# Patient Record
Sex: Female | Born: 1995 | Race: White | Hispanic: No | Marital: Single | Smoking: Never smoker
Health system: Southern US, Community
[De-identification: ages and names within clinical notes are randomized; demographics above are authoritative.]

## PROBLEM LIST (undated history)

## (undated) DIAGNOSIS — D649 Anemia, unspecified: Secondary | ICD-10-CM

## (undated) DIAGNOSIS — K5090 Crohn's disease, unspecified, without complications: Secondary | ICD-10-CM

## (undated) DIAGNOSIS — T7840XA Allergy, unspecified, initial encounter: Secondary | ICD-10-CM

## (undated) DIAGNOSIS — F419 Anxiety disorder, unspecified: Secondary | ICD-10-CM

## (undated) HISTORY — DX: Anxiety disorder, unspecified: F41.9

## (undated) HISTORY — DX: Allergy, unspecified, initial encounter: T78.40XA

## (undated) HISTORY — PX: BOWEL RESECTION: SHX1257

## (undated) HISTORY — DX: Crohn's disease, unspecified, without complications: K50.90

## (undated) HISTORY — PX: ILEOSTOMY: SHX1783

## (undated) HISTORY — DX: Anemia, unspecified: D64.9

## (undated) HISTORY — PX: BLADDER REPAIR: SHX76

---
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
## 2013-05-17 ENCOUNTER — Telehealth: Payer: Self-pay

## 2013-05-17 NOTE — Telephone Encounter (Signed)
Please advise 

## 2013-05-17 NOTE — Telephone Encounter (Signed)
Patient has Chron's disease and needs a statement written on a letterhead that she is ok to return to school at [employer]. Please patient at [phone] or her mother Trula Ore at [phone]

## 2013-05-19 ENCOUNTER — Encounter: Payer: Self-pay | Admitting: Internal Medicine

## 2013-11-10 ENCOUNTER — Other Ambulatory Visit: Payer: Self-pay | Admitting: Internal Medicine

## 2013-12-06 ENCOUNTER — Other Ambulatory Visit: Payer: Self-pay | Admitting: Internal Medicine

## 2014-09-21 ENCOUNTER — Telehealth: Payer: Self-pay

## 2014-09-21 NOTE — Telephone Encounter (Signed)
Pt's mother called to see when her daughter was seen last. States she is needing FMLA paperwork filled out and is wanting to know if we could do it if she had been here recently. Advised her of the date of pt's last visit.

## 2015-08-05 ENCOUNTER — Encounter (HOSPITAL_BASED_OUTPATIENT_CLINIC_OR_DEPARTMENT_OTHER): Payer: Self-pay | Admitting: *Deleted

## 2015-08-05 ENCOUNTER — Emergency Department (HOSPITAL_BASED_OUTPATIENT_CLINIC_OR_DEPARTMENT_OTHER)
Admission: EM | Admit: 2015-08-05 | Discharge: 2015-08-05 | Disposition: A | Discharge status: Home / Self Care | Payer: 59 | Attending: Emergency Medicine | Admitting: Emergency Medicine

## 2015-08-05 ENCOUNTER — Emergency Department (HOSPITAL_BASED_OUTPATIENT_CLINIC_OR_DEPARTMENT_OTHER): Payer: 59

## 2015-08-05 DIAGNOSIS — Z862 Personal history of diseases of the blood and blood-forming organs and certain disorders involving the immune mechanism: Secondary | ICD-10-CM | POA: Diagnosis not present

## 2015-08-05 DIAGNOSIS — F419 Anxiety disorder, unspecified: Secondary | ICD-10-CM | POA: Insufficient documentation

## 2015-08-05 DIAGNOSIS — Z23 Encounter for immunization: Secondary | ICD-10-CM | POA: Insufficient documentation

## 2015-08-05 DIAGNOSIS — Z8719 Personal history of other diseases of the digestive system: Secondary | ICD-10-CM | POA: Insufficient documentation

## 2015-08-05 DIAGNOSIS — Y9289 Other specified places as the place of occurrence of the external cause: Secondary | ICD-10-CM | POA: Diagnosis not present

## 2015-08-05 DIAGNOSIS — Y998 Other external cause status: Secondary | ICD-10-CM | POA: Insufficient documentation

## 2015-08-05 DIAGNOSIS — S61431A Puncture wound without foreign body of right hand, initial encounter: Secondary | ICD-10-CM | POA: Diagnosis not present

## 2015-08-05 DIAGNOSIS — W5501XA Bitten by cat, initial encounter: Secondary | ICD-10-CM | POA: Diagnosis not present

## 2015-08-05 DIAGNOSIS — S61432A Puncture wound without foreign body of left hand, initial encounter: Secondary | ICD-10-CM | POA: Insufficient documentation

## 2015-08-05 DIAGNOSIS — Y9389 Activity, other specified: Secondary | ICD-10-CM | POA: Diagnosis not present

## 2015-08-05 DIAGNOSIS — Z79899 Other long term (current) drug therapy: Secondary | ICD-10-CM | POA: Insufficient documentation

## 2015-08-05 DIAGNOSIS — S61451A Open bite of right hand, initial encounter: Secondary | ICD-10-CM

## 2015-08-05 MED ORDER — OXYCODONE-ACETAMINOPHEN 5-325 MG PO TABS: 1.0000 | ORAL_TABLET | ORAL | Status: AC | PRN

## 2015-08-05 MED ORDER — OXYCODONE-ACETAMINOPHEN 5-325 MG PO TABS
1.0000 | ORAL_TABLET | Freq: Once | ORAL | Status: AC
  Administered 2015-08-05: 1 via ORAL
  Filled 2015-08-05: qty 1

## 2015-08-05 MED ORDER — AMOXICILLIN-POT CLAVULANATE 875-125 MG PO TABS
1.0000 | ORAL_TABLET | Freq: Once | ORAL | Status: AC
  Administered 2015-08-05: 1 via ORAL
  Filled 2015-08-05: qty 1

## 2015-08-05 MED ORDER — AMOXICILLIN-POT CLAVULANATE 875-125 MG PO TABS: 1.0000 | ORAL_TABLET | Freq: Two times a day (BID) | ORAL | Status: AC

## 2015-08-05 MED ORDER — TETANUS-DIPHTH-ACELL PERTUSSIS 5-2.5-18.5 LF-MCG/0.5 IM SUSP
0.5000 mL | Freq: Once | INTRAMUSCULAR | Status: AC
  Administered 2015-08-05: 0.5 mL via INTRAMUSCULAR
  Filled 2015-08-05: qty 0.5

## 2015-08-05 NOTE — ED Notes (Signed)
Patient stable and ambulatory.  Patient verbalizes understanding of follow-up and discharge instructions.

## 2015-08-05 NOTE — ED Provider Notes (Signed)
CSN: [id number]     Arrival date & time 08/05/15  1133 History   First MD Initiated Contact with Patient 08/05/15 1200     Chief Complaint  Patient presents with  . Animal Bite     (Consider location/radiation/quality/duration/timing/severity/associated sxs/prior Treatment) HPI   Lauren Williamson is a 19 y.o. female, pt with history of Crohn's disease and Anxiety, presents with multiple cat bites to her right hand that occurred about PTA. Pt rates the pain at 8/10, throbbing/aching, and non-radiating. No numbness/tingling, weakness, or any other pain or complaints. Pt denies getting bit anywhere else.     Past Medical History  Diagnosis Date  . Allergy   . Anemia   . Anxiety   . Crohn's disease [facility])    Past Surgical History  Procedure Laterality Date  . Bowel resection    . Ileostomy  has been reversed  . Bladder repair     No family history on file. Social History  Substance Use Topics  . Smoking status: Never Smoker   . Smokeless tobacco: Never Used  . Alcohol Use: Yes     Comment: 1x/ month   OB History    No data available     Review of Systems  Constitutional: Negative for fever and chills.  Gastrointestinal: Negative for nausea and vomiting.  Skin: Positive for wound. Negative for color change and pallor.       Puncture wounds on right hand  Neurological: Negative for weakness and numbness.      Allergies  Morphine and related and Vancomycin  Home Medications   Prior to Admission medications   Medication Sig Start Date End Date Taking? Authorizing Provider  cetirizine (ZYRTEC) 10 MG tablet Take 10 mg by mouth daily.   Yes Historical Provider, MD  LORazepam (ATIVAN) 0.5 MG tablet Take 0.5 mg by mouth every 8 (eight) hours as needed for anxiety.   Yes Historical Provider, MD  norgestimate-ethinyl estradiol (PREVIFEM) 0.25-35 MG-MCG tablet Take 1 tablet by mouth daily. PATIENT NEEDS OFFICE VISIT FOR ADDITIONAL REFILLS 11/10/13  Yes Tonye Pearson, MD  amoxicillin-clavulanate (AUGMENTIN) 875-125 MG tablet Take 1 tablet by mouth every 12 (twelve) hours. 08/05/15   Xitlalic Maslin C Daly Whipkey, PA-C  azathioprine (IMURAN) 100 MG tablet Take 125 mg by mouth daily.    Historical Provider, MD  BIOTIN PO Take 1 tablet by mouth.    Historical Provider, MD  InFLIXimab (REMICADE IV) Inject into the vein.    Historical Provider, MD  mesalamine (PENTASA) 500 MG CR capsule Take 500 mg by mouth 2 (two) times daily.    Historical Provider, MD  oxyCODONE-acetaminophen (PERCOCET/ROXICET) 5-325 MG tablet Take 1-2 tablets by mouth every 4 (four) hours as needed for severe pain. 08/05/15   Brithney Bensen C Rhen Dossantos, PA-C   BP 120/86 mmHg  Pulse 70  Temp(Src) 98.3 F (36.8 C) (Oral)  Resp 18  Ht 5' 6.5" (1.689 m)  Wt 140 lb (63.504 kg)  BMI 22.26 kg/m2  SpO2 99%  LMP 07/08/2015 (Within Days) Physical Exam  Constitutional: She appears well-developed and well-nourished.  HENT:  Head: Normocephalic and atraumatic.  Musculoskeletal:  Pt has full ROM in right hand and wrist. Grips equal.   Neurological: She is alert.  No sensory deficits. Strength 5/5.  Skin: Skin is warm and dry.  Small puncture wounds to both dorsum and palmar surfaces of right hand. Bleeding controlled. No evidence of infection at this time.     ED Course  Procedures (including critical care  time) Labs Review Labs Reviewed - No data to display  Imaging Review Dg Hand Complete Right  08/05/2015  CLINICAL DATA:  Initial encounter.  CT by and scratches. EXAM: RIGHT HAND - COMPLETE 3+ VIEW COMPARISON:  None. FINDINGS: No evidence of fracture of the carpal or metacarpal bones. Radiocarpal joint is intact. Phalanges are normal. No soft tissue injury. No foreign body. Small soft tissue density along the skin adjacent to the fifth metacarpal could represent soft tissue injury IMPRESSION: 1. No fracture or foreign body. Electronically Signed   By: Genevive BiStewart  Edmunds M.D.   On: 08/05/2015 13:04   I have  personally reviewed and evaluated these images and lab results as part of my medical decision-making.   EKG Interpretation None      MDM   Final diagnoses:  Cat bite of right hand, initial encounter    Lauren Williamson presents with a cat bite.  Findings and plan of care discussed with Pricilla LovelessScott Goldston, MD.  Pt has isolated puncture wounds on her right hand. Pt to receive wound irrigation, pain management and ABX.  Pt unsure of last tetanus. Tetanus vaccine to be administered. Xrays free from fracture. Pt to be discharged with augmentin, pain control, and instructions for wound care. Pt to follow up with hand surgeon in a weak, or sooner should signs of infection arise. Pt mother at bedside confirms she will be giving pt a ride home. Cat was taken to the humane society, who said they would watch her for 10 days for signs of rabies or other illness.     Anselm PancoastShawn C Savan Ruta, PA-C 08/05/15 1317  Anselm PancoastShawn C Gorman Safi, PA-C 08/05/15 1319  Anselm PancoastShawn C Draydon Clairmont, PA-C 08/05/15 1329  Anselm PancoastShawn C Kieran Nachtigal, PA-C 08/05/15 1356  Pricilla LovelessScott Goldston, MD 08/06/15 [phone]

## 2015-08-05 NOTE — ED Notes (Signed)
[facility] and mother (owner of cat).  Completed form given to registration.

## 2015-08-05 NOTE — Discharge Instructions (Signed)
You have been seen today for a cat bite. Follow up with PCP as needed. Return to ED should symptoms worsen.  It is advised that you get your wound rechecked in 2-3 days to assure proper healing. You may do this at your PCP or come back to the ED.

## 2015-08-05 NOTE — ED Notes (Signed)
Cat bite to right hand. Pt bitten by a cat she had obtained from a friend. Puncture marks to right hand

## 2015-11-16 DIAGNOSIS — K50919 Crohn's disease, unspecified, with unspecified complications: Secondary | ICD-10-CM

## 2018-05-02 ENCOUNTER — Emergency Department (HOSPITAL_BASED_OUTPATIENT_CLINIC_OR_DEPARTMENT_OTHER)
Admission: EM | Admit: 2018-05-02 | Discharge: 2018-05-02 | Disposition: A | Discharge status: Home / Self Care | Payer: 59 | Attending: Emergency Medicine | Admitting: Emergency Medicine

## 2018-05-02 ENCOUNTER — Emergency Department (HOSPITAL_BASED_OUTPATIENT_CLINIC_OR_DEPARTMENT_OTHER): Payer: 59

## 2018-05-02 ENCOUNTER — Other Ambulatory Visit: Payer: Self-pay

## 2018-05-02 ENCOUNTER — Encounter (HOSPITAL_BASED_OUTPATIENT_CLINIC_OR_DEPARTMENT_OTHER): Payer: Self-pay | Admitting: Emergency Medicine

## 2018-05-02 DIAGNOSIS — Z79899 Other long term (current) drug therapy: Secondary | ICD-10-CM | POA: Insufficient documentation

## 2018-05-02 DIAGNOSIS — N201 Calculus of ureter: Secondary | ICD-10-CM | POA: Diagnosis not present

## 2018-05-02 DIAGNOSIS — R1031 Right lower quadrant pain: Secondary | ICD-10-CM | POA: Diagnosis present

## 2018-05-02 LAB — COMPREHENSIVE METABOLIC PANEL
ALK PHOS: 46 U/L (ref 38–126)
ALT: 16 U/L (ref 0–44)
AST: 23 U/L (ref 15–41)
Albumin: 4.4 g/dL (ref 3.5–5.0)
Anion gap: 11 (ref 5–15)
BILIRUBIN TOTAL: 1.6 mg/dL — AB (ref 0.3–1.2)
BUN: 12 mg/dL (ref 6–20)
CHLORIDE: 104 mmol/L (ref 98–111)
CO2: 24 mmol/L (ref 22–32)
CREATININE: 0.88 mg/dL (ref 0.44–1.00)
Calcium: 9.4 mg/dL (ref 8.9–10.3)
Glucose, Bld: 104 mg/dL — ABNORMAL HIGH (ref 70–99)
POTASSIUM: 3.9 mmol/L (ref 3.5–5.1)
SODIUM: 139 mmol/L (ref 135–145)
Total Protein: 8.6 g/dL — ABNORMAL HIGH (ref 6.5–8.1)

## 2018-05-02 LAB — URINALYSIS, ROUTINE W REFLEX MICROSCOPIC
Bilirubin Urine: NEGATIVE
GLUCOSE, UA: NEGATIVE mg/dL
Ketones, ur: NEGATIVE mg/dL
LEUKOCYTES UA: NEGATIVE
Nitrite: NEGATIVE
PH: 6 (ref 5.0–8.0)
PROTEIN: NEGATIVE mg/dL
SPECIFIC GRAVITY, URINE: 1.01 (ref 1.005–1.030)

## 2018-05-02 LAB — PREGNANCY, URINE: Preg Test, Ur: NEGATIVE

## 2018-05-02 LAB — CBC
HEMATOCRIT: 42.4 % (ref 36.0–46.0)
HEMOGLOBIN: 14.7 g/dL (ref 12.0–15.0)
MCH: 31.7 pg (ref 26.0–34.0)
MCHC: 34.7 g/dL (ref 30.0–36.0)
MCV: 91.4 fL (ref 78.0–100.0)
Platelets: 281 10*3/uL (ref 150–400)
RBC: 4.64 MIL/uL (ref 3.87–5.11)
RDW: 12.2 % (ref 11.5–15.5)
WBC: 11 10*3/uL — ABNORMAL HIGH (ref 4.0–10.5)

## 2018-05-02 LAB — LIPASE, BLOOD: LIPASE: 31 U/L (ref 11–51)

## 2018-05-02 LAB — URINALYSIS, MICROSCOPIC (REFLEX): RBC / HPF: >50 RBC/hpf (ref 0–5)

## 2018-05-02 MED ORDER — HYDROCODONE-ACETAMINOPHEN 5-325 MG PO TABS: 1.0000 | ORAL_TABLET | ORAL | 0 refills | Status: AC | PRN

## 2018-05-02 MED ORDER — SODIUM CHLORIDE 0.9 % IV BOLUS
1000.0000 mL | Freq: Once | INTRAVENOUS | Status: AC
  Administered 2018-05-02: 1000 mL via INTRAVENOUS

## 2018-05-02 MED ORDER — HYDROMORPHONE HCL 1 MG/ML IJ SOLN
1.0000 mg | Freq: Once | INTRAMUSCULAR | Status: AC
  Administered 2018-05-02: 1 mg via INTRAVENOUS
  Filled 2018-05-02: qty 1

## 2018-05-02 MED ORDER — ONDANSETRON HCL 4 MG/2ML IJ SOLN
4.0000 mg | Freq: Once | INTRAMUSCULAR | Status: AC
  Administered 2018-05-02: 4 mg via INTRAVENOUS
  Filled 2018-05-02: qty 2

## 2018-05-02 NOTE — ED Triage Notes (Signed)
Patient states that she has had pain to her right flank x 24 hours  - the patient reports that she has had N/V

## 2018-05-02 NOTE — ED Provider Notes (Signed)
[facility] EMERGENCY DEPARTMENT Provider Note   CSN: [id number] Arrival date & time: 05/02/18  4098     History   Chief Complaint Chief Complaint  Patient presents with  . Flank Pain    HPI Lauren Williamson is a 22 y.o. female.  HPI  22 year old female presents with right-sided back pain starting last night.  She took some Tylenol with no relief.  Pain comes and goes.  When it comes it is like a hot poker/screwdriver that is being stabbed into her back.  It goes straight through to her right abdomen.  There is some right flank pain as well.  No fevers but she is had some chills and has been vomiting.  Took some Zofran with partial relief of the nausea.  Pain is rated as severe.  She is been having some decreased urine output but no burning or blood.  LMP 2 weeks ago.  Past Medical History:  Diagnosis Date  . Allergy   . Anemia   . Anxiety   . Crohn's disease [facility])     Patient Active Problem List   Diagnosis Date Noted  . GAD (generalized anxiety disorder) 09/11/2012  . Inflammatory bowel disease (Crohn's disease) (HCC) 06/02/2012  . Scoliosis 06/02/2012    Past Surgical History:  Procedure Laterality Date  . BLADDER REPAIR    . BOWEL RESECTION    . ILEOSTOMY  has been reversed     OB History   None      Home Medications    Prior to Admission medications   Medication Sig Start Date End Date Taking? Authorizing Provider  dicyclomine (BENTYL) 10 MG capsule Take 10 mg by mouth 4 (four) times daily -  before meals and at bedtime.   Yes [provider]  amoxicillin-clavulanate (AUGMENTIN) 875-125 MG tablet Take 1 tablet by mouth every 12 (twelve) hours. 08/05/15   Joy, Shawn C, PA-C  azathioprine (IMURAN) 100 MG tablet Take 125 mg by mouth daily.    [provider]  BIOTIN PO Take 1 tablet by mouth.    [provider]  cetirizine (ZYRTEC) 10 MG tablet Take 10 mg by mouth daily.    [provider]    HYDROcodone-acetaminophen (NORCO) 5-325 MG tablet Take 1-2 tablets by mouth every 4 (four) hours as needed for severe pain. 05/02/18   Pricilla Loveless, MD  InFLIXimab (REMICADE IV) Inject into the vein.    [provider]  LORazepam (ATIVAN) 0.5 MG tablet Take 0.5 mg by mouth every 8 (eight) hours as needed for anxiety.    [provider]  mesalamine (PENTASA) 500 MG CR capsule Take 500 mg by mouth 2 (two) times daily.    [provider]  norgestimate-ethinyl estradiol (PREVIFEM) 0.25-35 MG-MCG tablet Take 1 tablet by mouth daily. PATIENT NEEDS OFFICE VISIT FOR ADDITIONAL REFILLS 11/10/13   Tonye Pearson, MD  oxyCODONE-acetaminophen (PERCOCET/ROXICET) 5-325 MG tablet Take 1-2 tablets by mouth every 4 (four) hours as needed for severe pain. 08/05/15   Anselm Pancoast, PA-C    Family History History reviewed. No pertinent family history.  Social History Social History   Tobacco Use  . Smoking status: Never Smoker  . Smokeless tobacco: Never Used  Substance Use Topics  . Alcohol use: Yes    Comment: 1x/ month  . Drug use: No     Allergies   Morphine and related and Vancomycin   Review of Systems Review of Systems  Constitutional: Positive for chills. Negative for fever.  Gastrointestinal: Positive for abdominal pain, nausea and vomiting.  Genitourinary: Positive for decreased urine volume and flank pain. Negative for dysuria.  Musculoskeletal: Positive for back pain.  All other systems reviewed and are negative.    Physical Exam Updated Vital Signs BP 115/79   Pulse 79   Temp 98.8 F (37.1 C) (Oral)   Resp 18   Ht 5\' 6"  (1.676 m)   Wt 65.8 kg (145 lb)   LMP 04/18/2018   SpO2 100%   BMI 23.40 kg/m   Physical Exam  Constitutional: She is oriented to person, place, and time. She appears well-developed and well-nourished.  HENT:  Head: Normocephalic and atraumatic.  Right Ear: External ear normal.  Left Ear: External ear normal.  Nose:  Nose normal.  Eyes: Right eye exhibits no discharge. Left eye exhibits no discharge.  Cardiovascular: Normal rate, regular rhythm and normal heart sounds.  Pulmonary/Chest: Effort normal and breath sounds normal.  Abdominal: Soft. She exhibits no distension. There is no tenderness. There is no CVA tenderness.  Musculoskeletal:  No reproducible back tenderness  Neurological: She is alert and oriented to person, place, and time.  Skin: Skin is warm and dry. She is not diaphoretic.  Nursing note and vitals reviewed.    ED Treatments / Results  Labs (all labs ordered are listed, but only abnormal results are displayed) Labs Reviewed  URINALYSIS, ROUTINE W REFLEX MICROSCOPIC - Abnormal; Notable for the following components:      Result Value   APPearance HAZY (*)    Hgb urine dipstick LARGE (*)    All other components within normal limits  CBC - Abnormal; Notable for the following components:   WBC 11.0 (*)    All other components within normal limits  COMPREHENSIVE METABOLIC PANEL - Abnormal; Notable for the following components:   Glucose, Bld 104 (*)    Total Protein 8.6 (*)    Total Bilirubin 1.6 (*)    All other components within normal limits  URINALYSIS, MICROSCOPIC (REFLEX) - Abnormal; Notable for the following components:   Bacteria, UA MANY (*)    All other components within normal limits  URINE CULTURE  PREGNANCY, URINE  LIPASE, BLOOD    EKG None  Radiology Ct Renal Stone Study  Result Date: 05/02/2018 CLINICAL DATA:  22 year old female with a history of right flank pain EXAM: CT ABDOMEN AND PELVIS WITHOUT CONTRAST TECHNIQUE: Multidetector CT imaging of the abdomen and pelvis was performed following the standard protocol without IV contrast. COMPARISON:  06/17/2015 FINDINGS: Lower chest: No acute abnormality. Hepatobiliary: No focal liver abnormality is seen. No gallstones, gallbladder wall thickening, or biliary dilatation. Pancreas: Unremarkable pancreas. Spleen:  Unremarkable spleen Adrenals/Urinary Tract: Unremarkable adrenal glands. Right kidney with new hydronephrosis and mild perinephric stranding. Obstructing stone at the proximal right ureter measures approximately 5 mm. Mild stranding around the right ureter. There are tiny additional right-sided stones at the inferior collecting system. Left kidney without hydronephrosis or nephrolithiasis. Urinary bladder unremarkable. Stomach/Bowel: Unremarkable appearance of stomach. Unremarkable small bowel. Surgical changes at the cecum. Appendix is not visualized, however, no inflammatory changes are present adjacent to the cecum to indicate an appendicitis. Mild stool burden. No focal inflammatory changes of the:. Diverticular disease without associated inflammation. Vascular/Lymphatic: No significant atherosclerotic disease. No adenopathy Reproductive: Unremarkable appearance of the uterus and the adnexa Other: None Musculoskeletal: No acute displaced fracture. No significant degenerative changes of the spine IMPRESSION: Obstructing right ureteral stone measures 5 mm in the proximal ureter, with hydronephrosis. If there  is concern for urinary tract infection, correlation with urinalysis may be useful. These results were called by telephone at the time of interpretation on 05/02/2018 at 9:51 am to Dr. Pricilla LovelessSCOTT Annaliese Saez , who verbally acknowledged these results. Surgical changes of the colon. Electronically Signed   By: Gilmer MorJaime  Wagner D.O.   On: 05/02/2018 09:54    Procedures Procedures (including critical care time)  Medications Ordered in ED Medications  HYDROmorphone (DILAUDID) injection 1 mg (1 mg Intravenous Given 05/02/18 0855)  sodium chloride 0.9 % bolus 1,000 mL ( Intravenous Stopped 05/02/18 1004)  ondansetron (ZOFRAN) injection 4 mg (4 mg Intravenous Given 05/02/18 0855)     Initial Impression / Assessment and Plan / ED Course  I have reviewed the triage vital signs and the nursing notes.  Pertinent labs &  imaging results that were available during my care of the patient were reviewed by me and considered in my medical decision making (see chart for details).     CT scan confirms right ureteral stone.  She is feeling much better after dose of pain medicine.  Now pain is down to a 2/10 and she is comfortable and feels well enough for discharge.  She has bacteria in her urine but also has squamous epithelial cells.  I will send for culture but with no nitrites and negative leukocytes, this seems more likely to be a symptom attic bacteriuria.  She states she has had UTIs before and this does not feel similar.  I highly doubt this is an infected stone but I have given her strict return precautions.  She has Crohn's disease and states she is told not to take ibuprofen prolonged but can take for short periods and thus is encouraged to do so.  We will also give hydrocodone.  Follow-up with urology.  Final Clinical Impressions(s) / ED Diagnoses   Final diagnoses:  Right ureteral stone    ED Discharge Orders        Ordered    HYDROcodone-acetaminophen (NORCO) 5-325 MG tablet  Every 4 hours PRN     05/02/18 1019       Pricilla LovelessGoldston, Lysbeth Dicola, MD 05/02/18 1030

## 2018-05-02 NOTE — Discharge Instructions (Addendum)
Return to the ER if you develop worsening pain, uncontrolled pain, or if you develop any burning when you urinate, feeling like you have a urinary tract infection, fever, vomiting, or any other new/concerning symptoms.

## 2018-05-02 NOTE — ED Notes (Signed)
CT will need to wait for results of urine pregnancy test prior to imaging

## 2018-05-03 LAB — URINE CULTURE: Culture: NO GROWTH

## 2018-11-08 ENCOUNTER — Other Ambulatory Visit: Payer: Self-pay

## 2018-11-08 ENCOUNTER — Emergency Department (HOSPITAL_BASED_OUTPATIENT_CLINIC_OR_DEPARTMENT_OTHER)
Admission: EM | Admit: 2018-11-08 | Discharge: 2018-11-08 | Disposition: A | Discharge status: Home / Self Care | Payer: 59 | Attending: Emergency Medicine | Admitting: Emergency Medicine

## 2018-11-08 ENCOUNTER — Encounter (HOSPITAL_BASED_OUTPATIENT_CLINIC_OR_DEPARTMENT_OTHER): Payer: Self-pay | Admitting: Emergency Medicine

## 2018-11-08 DIAGNOSIS — J029 Acute pharyngitis, unspecified: Secondary | ICD-10-CM | POA: Insufficient documentation

## 2018-11-08 DIAGNOSIS — Z79899 Other long term (current) drug therapy: Secondary | ICD-10-CM | POA: Insufficient documentation

## 2018-11-08 DIAGNOSIS — R590 Localized enlarged lymph nodes: Secondary | ICD-10-CM | POA: Diagnosis not present

## 2018-11-08 DIAGNOSIS — R070 Pain in throat: Secondary | ICD-10-CM | POA: Diagnosis present

## 2018-11-08 LAB — CBC WITH DIFFERENTIAL/PLATELET
Abs Immature Granulocytes: 0.01 10*3/uL (ref 0.00–0.07)
BASOS PCT: 0 %
Basophils Absolute: 0 10*3/uL (ref 0.0–0.1)
Eosinophils Absolute: 0 10*3/uL (ref 0.0–0.5)
Eosinophils Relative: 0 %
HEMATOCRIT: 43.7 % (ref 36.0–46.0)
Hemoglobin: 14.5 g/dL (ref 12.0–15.0)
Immature Granulocytes: 0 %
Lymphocytes Relative: 32 %
Lymphs Abs: 2.6 10*3/uL (ref 0.7–4.0)
MCH: 30.7 pg (ref 26.0–34.0)
MCHC: 33.2 g/dL (ref 30.0–36.0)
MCV: 92.4 fL (ref 80.0–100.0)
MONO ABS: 0.6 10*3/uL (ref 0.1–1.0)
Monocytes Relative: 7 %
NEUTROS ABS: 4.9 10*3/uL (ref 1.7–7.7)
Neutrophils Relative %: 61 %
Platelets: 171 10*3/uL (ref 150–400)
RBC: 4.73 MIL/uL (ref 3.87–5.11)
RDW: 11.3 % — AB (ref 11.5–15.5)
Smear Review: NORMAL
WBC: 8.1 10*3/uL (ref 4.0–10.5)
nRBC: 0 % (ref 0.0–0.2)

## 2018-11-08 LAB — BASIC METABOLIC PANEL
Anion gap: 8 (ref 5–15)
BUN: 11 mg/dL (ref 6–20)
CO2: 29 mmol/L (ref 22–32)
CREATININE: 0.56 mg/dL (ref 0.44–1.00)
Calcium: 8.9 mg/dL (ref 8.9–10.3)
Chloride: 100 mmol/L (ref 98–111)
Glucose, Bld: 94 mg/dL (ref 70–99)
POTASSIUM: 3 mmol/L — AB (ref 3.5–5.1)
SODIUM: 137 mmol/L (ref 135–145)

## 2018-11-08 MED ORDER — IBUPROFEN 400 MG PO TABS: 400.0000 mg | ORAL_TABLET | Freq: Four times a day (QID) | ORAL | 0 refills | Status: AC | PRN

## 2018-11-08 MED ORDER — CLINDAMYCIN PHOSPHATE 600 MG/50ML IV SOLN
600.0000 mg | Freq: Once | INTRAVENOUS | Status: AC
  Administered 2018-11-08: 600 mg via INTRAVENOUS
  Filled 2018-11-08: qty 50

## 2018-11-08 MED ORDER — KETOROLAC TROMETHAMINE 30 MG/ML IJ SOLN
30.0000 mg | Freq: Once | INTRAMUSCULAR | Status: AC
  Administered 2018-11-08: 30 mg via INTRAVENOUS
  Filled 2018-11-08: qty 1

## 2018-11-08 MED ORDER — LACTATED RINGERS IV BOLUS
2000.0000 mL | Freq: Once | INTRAVENOUS | Status: AC
  Administered 2018-11-08: 2000 mL via INTRAVENOUS

## 2018-11-08 MED ORDER — HYDROCODONE-ACETAMINOPHEN 5-325 MG PO TABS: 1.0000 | ORAL_TABLET | Freq: Four times a day (QID) | ORAL | 0 refills | Status: AC | PRN

## 2018-11-08 MED ORDER — ONDANSETRON 4 MG PO TBDP: 4.0000 mg | ORAL_TABLET | ORAL | 0 refills | Status: AC | PRN

## 2018-11-08 MED ORDER — CLINDAMYCIN HCL 300 MG PO CAPS: 300.0000 mg | ORAL_CAPSULE | Freq: Four times a day (QID) | ORAL | 0 refills | Status: AC

## 2018-11-08 MED ORDER — DEXAMETHASONE SODIUM PHOSPHATE 10 MG/ML IJ SOLN
10.0000 mg | Freq: Once | INTRAMUSCULAR | Status: AC
  Administered 2018-11-08: 10 mg via INTRAVENOUS
  Filled 2018-11-08: qty 1

## 2018-11-08 NOTE — ED Provider Notes (Signed)
[facility] EMERGENCY DEPARTMENT Provider Note   CSN: [id number] Arrival date & time: 11/08/18  [id number]     History   Chief Complaint Chief Complaint  Patient presents with  . Sore Throat    HPI Lauren Williamson is a 23 y.o. female.  HPI Patient reports she had influenza diagnosed 5 days ago.  She is just finishing Tamiflu.  She reports about 2 days into her influenza illness she started developing a pretty bad sore throat.  She was seen by her doctor and at that time not thought to have a bacterial infection.  She reports a strep swab was done.  She was then seen 2 days later as the swelling had worsened.  She reports strep was again negative.  Today she reports that she is getting better from her flu symptoms but getting worse in terms of the swelling and pain in her throat.  She reports is very painful and she is having difficulty swallowing liquids.  She reports as a kid she did have strep throat several times but never had tonsillectomy. Past Medical History:  Diagnosis Date  . Allergy   . Anemia   . Anxiety   . Crohn's disease [facility](HCC)     Patient Active Problem List   Diagnosis Date Noted  . GAD (generalized anxiety disorder) 09/11/2012  . Inflammatory bowel disease (Crohn's disease) (HCC) 06/02/2012  . Scoliosis 06/02/2012    Past Surgical History:  Procedure Laterality Date  . BLADDER REPAIR    . BOWEL RESECTION    . ILEOSTOMY  has been reversed     OB History   No obstetric history on file.      Home Medications    Prior to Admission medications   Medication Sig Start Date End Date Taking? Authorizing Provider  albuterol (PROVENTIL HFA;VENTOLIN HFA) 108 (90 Base) MCG/ACT inhaler Inhale 2 puffs into the lungs every 6 (six) hours as needed for wheezing or shortness of breath.   Yes [provider]  dicyclomine (BENTYL) 10 MG capsule Take 10 mg by mouth 4 (four) times daily -  before meals and at bedtime.   Yes [provider]  HUMIRA  10 MG/0.1ML PSKT Inject into the skin.   Yes [provider]  norgestimate-ethinyl estradiol (PREVIFEM) 0.25-35 MG-MCG tablet Take 1 tablet by mouth daily. PATIENT NEEDS OFFICE VISIT FOR ADDITIONAL REFILLS 11/10/13  Yes Tonye Pearsonoolittle, Robert P, MD  amoxicillin-clavulanate (AUGMENTIN) 875-125 MG tablet Take 1 tablet by mouth every 12 (twelve) hours. 08/05/15   Joy, Shawn C, PA-C  azathioprine (IMURAN) 100 MG tablet Take 125 mg by mouth daily.    [provider]  BIOTIN PO Take 1 tablet by mouth.    [provider]  cetirizine (ZYRTEC) 10 MG tablet Take 10 mg by mouth daily.    [provider]  clindamycin (CLEOCIN) 300 MG capsule Take 1 capsule (300 mg total) by mouth 4 (four) times daily. X 7 days 11/08/18   Arby BarrettePfeiffer, Maylea Soria, MD  HYDROcodone-acetaminophen (NORCO) 5-325 MG tablet Take 1-2 tablets by mouth every 4 (four) hours as needed for severe pain. 05/02/18   Pricilla LovelessGoldston, Scott, MD  HYDROcodone-acetaminophen (NORCO/VICODIN) 5-325 MG tablet Take 1-2 tablets by mouth every 6 (six) hours as needed for moderate pain or severe pain. 11/08/18   Arby BarrettePfeiffer, Lorris Carducci, MD  ibuprofen (ADVIL,MOTRIN) 400 MG tablet Take 1 tablet (400 mg total) by mouth every 6 (six) hours as needed. 11/08/18   Arby BarrettePfeiffer, Ellyana Crigler, MD  InFLIXimab (REMICADE IV) Inject into the  vein.    [provider]  LORazepam (ATIVAN) 0.5 MG tablet Take 0.5 mg by mouth every 8 (eight) hours as needed for anxiety.    [provider]  mesalamine (PENTASA) 500 MG CR capsule Take 500 mg by mouth 2 (two) times daily.    [provider]  ondansetron (ZOFRAN ODT) 4 MG disintegrating tablet Take 1 tablet (4 mg total) by mouth every 4 (four) hours as needed for nausea or vomiting. 11/08/18   Arby Barrette, MD  oxyCODONE-acetaminophen (PERCOCET/ROXICET) 5-325 MG tablet Take 1-2 tablets by mouth every 4 (four) hours as needed for severe pain. 08/05/15   Anselm Pancoast, PA-C    Family History History reviewed. No  pertinent family history.  Social History Social History   Tobacco Use  . Smoking status: Never Smoker  . Smokeless tobacco: Never Used  Substance Use Topics  . Alcohol use: Yes    Comment: 1x/ month  . Drug use: No     Allergies   Morphine and related and Vancomycin   Review of Systems Review of Systems 10 Systems reviewed and are negative for acute change except as noted in the HPI.   Physical Exam Updated Vital Signs BP (!) 130/104 (BP Location: Left Arm)   Pulse 82   Temp 98.3 F (36.8 C) (Oral)   Resp 16   Ht 5' 5.5" (1.664 m)   Wt 65.8 kg   LMP  (LMP Unknown)   SpO2 99%   BMI 23.76 kg/m   Physical Exam Constitutional:      Appearance: She is well-developed.     Comments: Patient is alert.  She is uncomfortable in appearance.  No respiratory distress.  Mental status clear.  HENT:     Head: Normocephalic and atraumatic.     Comments: Patient has thick whitish-gray exudate on the right tonsil.  Tonsil is moderately enlarged but does not have a full fluctuant area.  Left tonsil scant exudate. Eyes:     Pupils: Pupils are equal, round, and reactive to light.  Neck:     Musculoskeletal: Neck supple.     Comments: Patient has an area of swelling on the right lateral neck just anterior and over the SCM consistent with lymphadenopathy approximately 6 cm.  Patient does not have any meningismus or stridor. Cardiovascular:     Rate and Rhythm: Normal rate and regular rhythm.     Heart sounds: Normal heart sounds.  Pulmonary:     Effort: Pulmonary effort is normal.     Breath sounds: Normal breath sounds.  Abdominal:     General: Bowel sounds are normal. There is no distension.     Palpations: Abdomen is soft.     Tenderness: There is no abdominal tenderness.  Musculoskeletal: Normal range of motion.  Skin:    General: Skin is warm and dry.  Neurological:     Mental Status: She is alert and oriented to person, place, and time.     GCS: GCS eye subscore is 4.  GCS verbal subscore is 5. GCS motor subscore is 6.     Coordination: Coordination normal.      ED Treatments / Results  Labs (all labs ordered are listed, but only abnormal results are displayed) Labs Reviewed  BASIC METABOLIC PANEL - Abnormal; Notable for the following components:      Result Value   Potassium 3.0 (*)    All other components within normal limits  CBC WITH DIFFERENTIAL/PLATELET - Abnormal; Notable for the following  components:   RDW 11.3 (*)    All other components within normal limits    EKG None  Radiology No results found.  Procedures Procedures (including critical care time) Medications Ordered in ED Medications  clindamycin (CLEOCIN) IVPB 600 mg (0 mg Intravenous Stopped 11/08/18 1134)  lactated ringers bolus 2,000 mL (0 mLs Intravenous Stopped 11/08/18 1142)  dexamethasone (DECADRON) injection 10 mg (10 mg Intravenous Given 11/08/18 1027)  ketorolac (TORADOL) 30 MG/ML injection 30 mg (30 mg Intravenous Given 11/08/18 1027)     Initial Impression / Assessment and Plan / ED Course  I have reviewed the triage vital signs and the nursing notes.  Pertinent labs & imaging results that were available during my care of the patient were reviewed by me and considered in my medical decision making (see chart for details).  Clinical Course as of Nov 09 1639  Sun Nov 08, 2018  1031 Patient has had Toradol.  It is starting fluid resuscitation.  She reports feeling slightly improved at this time.   [MP]  1132 Patient feels much improved and is tolerating oral intake at this point.   [MP]    Clinical Course User Index [MP] Arby Barrette, MD   Patient has recently had influenza and is improving in terms of cough and generalized myalgias.  She has however having worsening pharyngitis.  Patient does have thick exudative plaque on the right tonsil and cervical lymphadenopathy.  At this time I do not see clinical indication of any drainable peritonsillar abscess.  Will  administer clindamycin and rehydration.  Patient improved much with medications.  She is now tolerating oral intake.  We will continue clindamycin and recommend follow-up with ENT.  Final Clinical Impressions(s) / ED Diagnoses   Final diagnoses:  Exudative pharyngitis  Lymphadenopathy of right cervical region    ED Discharge Orders         Ordered    ibuprofen (ADVIL,MOTRIN) 400 MG tablet  Every 6 hours PRN     11/08/18 1128    clindamycin (CLEOCIN) 300 MG capsule  4 times daily     11/08/18 1128    HYDROcodone-acetaminophen (NORCO/VICODIN) 5-325 MG tablet  Every 6 hours PRN     11/08/18 1128    ondansetron (ZOFRAN ODT) 4 MG disintegrating tablet  Every 4 hours PRN     11/08/18 1128           Arby Barrette, MD 11/08/18 1642

## 2018-11-08 NOTE — ED Triage Notes (Signed)
Pt here with sore throat. Last strep test done 4 days ago. Coated white in front of right tonsil and redness noted throughout throat. Patient is in too much pain to eat or drink much. Foul smelling breath noted. No fever. Just got over the flu.

## 2018-11-08 NOTE — Discharge Instructions (Signed)
1.  You have been given a dose of Decadron in the emergency department to help with swelling in your throat and neck.  This should continue to help over the next couple of days.  Take 400 mg dose of ibuprofen with food for the next 2 to 3 days to help with pain and swelling.  Discontinue ibuprofen as soon as your symptoms are improved.  You have also been given a prescription for Vicodin to help with additional pain control.  Take 1-2 every 6 hours for pain over the next 1 to 3 days as needed.  Use sparingly and discontinue as soon as symptoms are improved. 2.  You should have close follow-up with your doctor.  You need close monitoring of your response to the antibiotics and medications since you have Crohn's disease. 3.  You should schedule a follow-up appointment with an ear nose throat specialist.  See the physician listed in your discharge instructions. 4.  You must return to the emergency department if your swelling or symptoms are worsening.  Return if you are developing difficulty swallowing, breathing or feeling generally more ill with fevers.

## 2019-04-02 IMAGING — CT CT RENAL STONE PROTOCOL
2 of 4 series · 16 of 46 positions shown, 18 images · non-contrast
Comparison: 06/17/2015

CLINICAL DATA: 22-year-old female with a history of right flank
pain

EXAM:
CT ABDOMEN AND PELVIS WITHOUT CONTRAST
TECHNIQUE: Multidetector CT imaging of the abdomen and pelvis was performed
following the standard protocol without IV contrast.

[Series 2: axial st · axial · 0.75mm/px · z∈[-476,-46]mm · 13 of 94 slices shown, 15 images]
[im 4/94  soft-tissue]
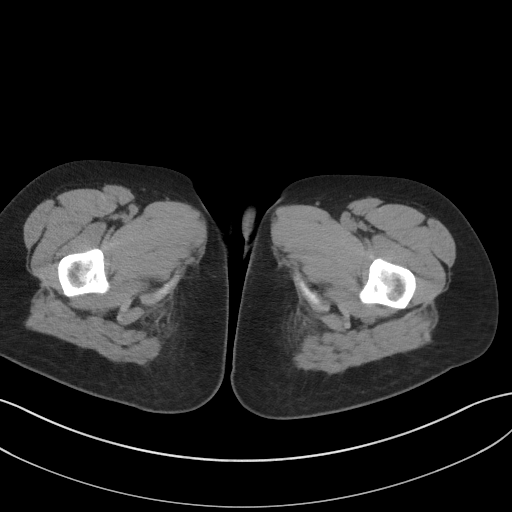
[im 4/94  bone]
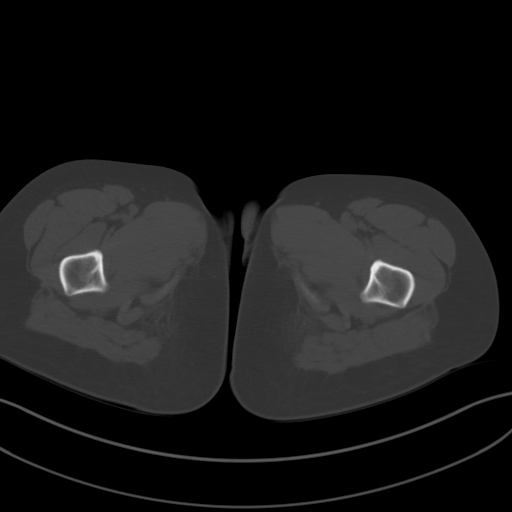
[im 12/94  soft-tissue]
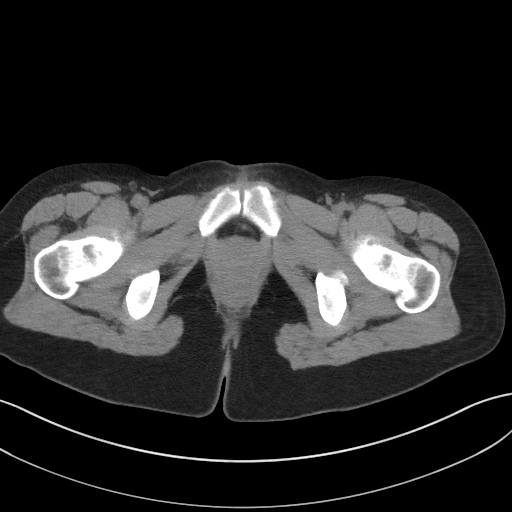
[im 19/94  soft-tissue]
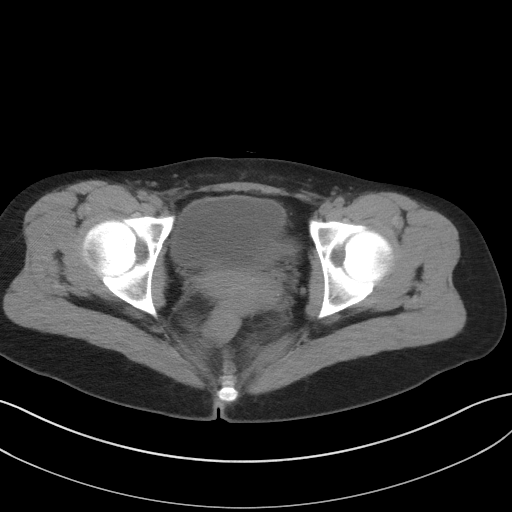
[im 27/94  soft-tissue]
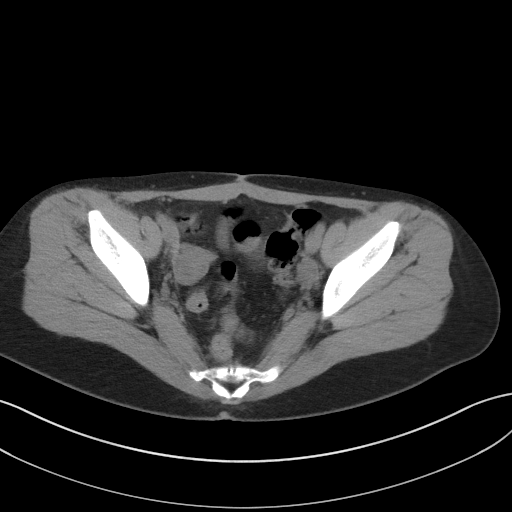
[im 34/94  soft-tissue]
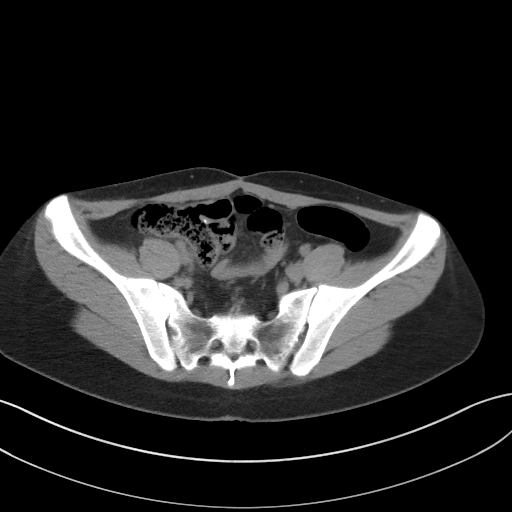
[im 41/94  soft-tissue]
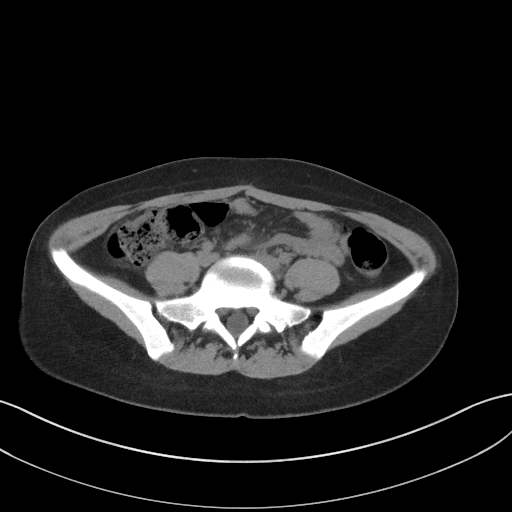
[im 49/94  soft-tissue]
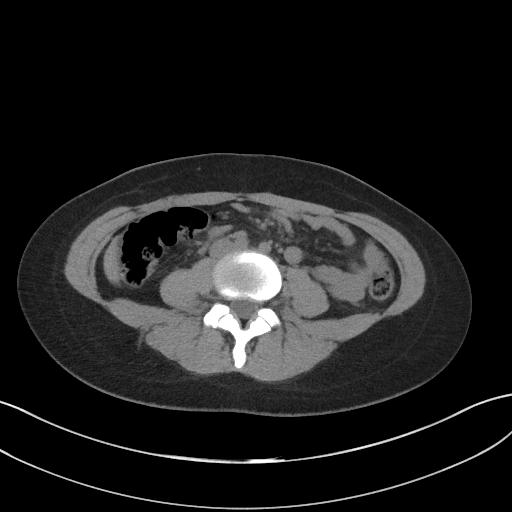
[im 53/94  soft-tissue]
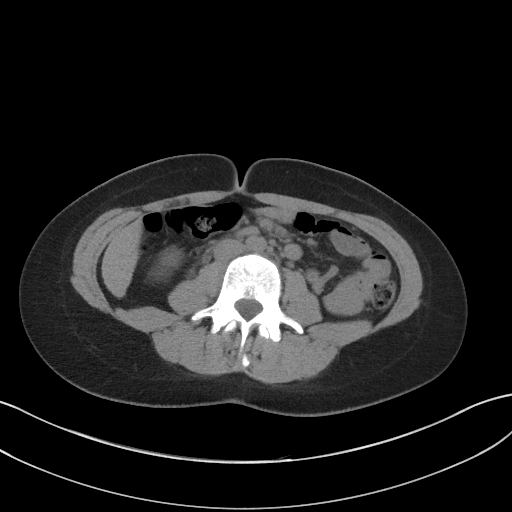
[im 60/94  soft-tissue]
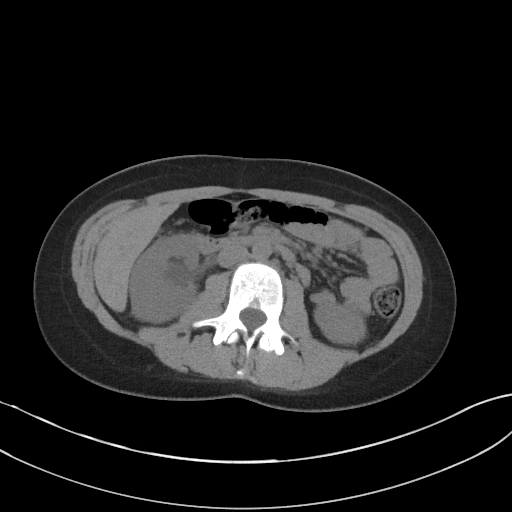
[im 60/94  bone]
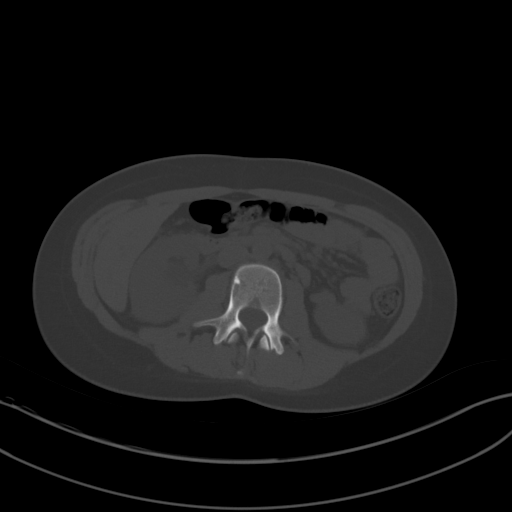
[im 67/94  soft-tissue]
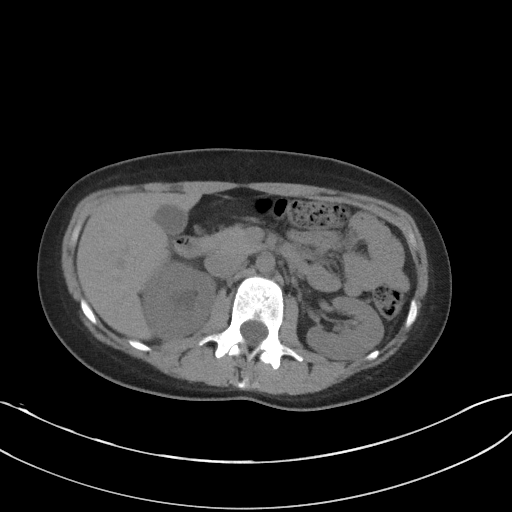
[im 75/94  soft-tissue]
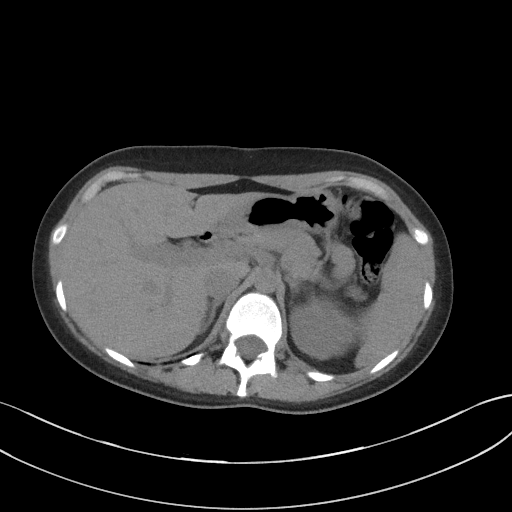
[im 82/94  soft-tissue]
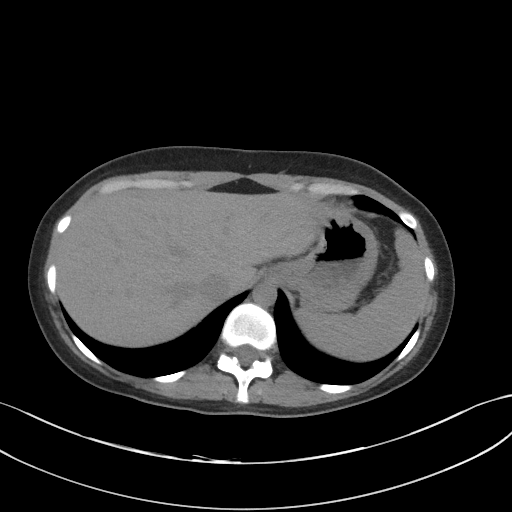
[im 90/94  soft-tissue]
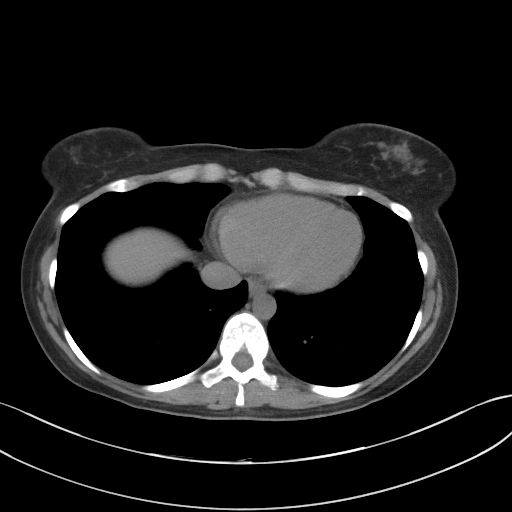

[Series 5: coronal st · coronal · 0.84mm/px · 3 of 66 slices shown]
[im 22/66  soft-tissue]
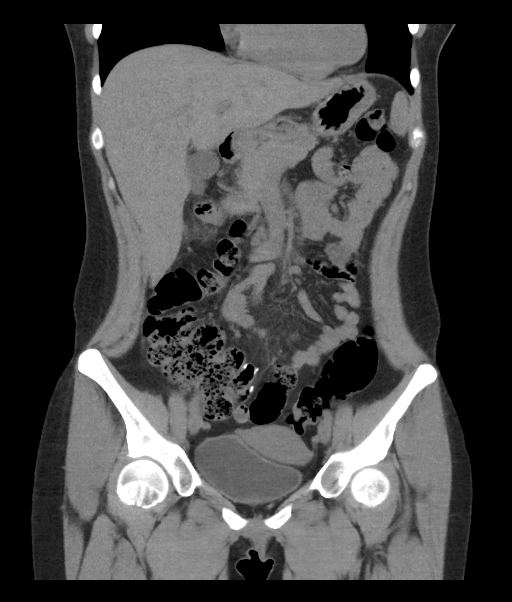
[im 29/66  soft-tissue]
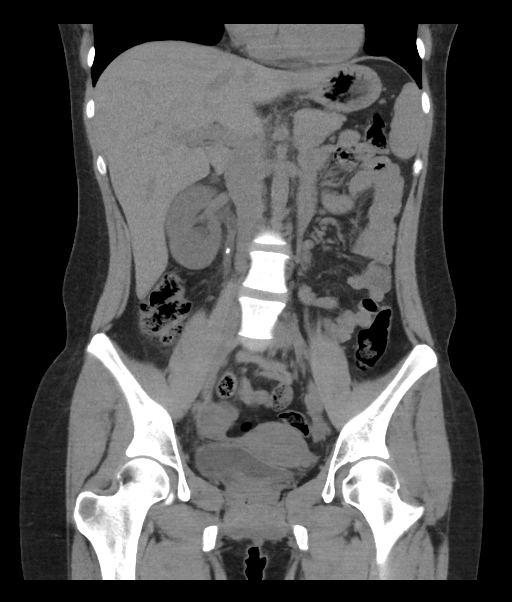
[im 37/66  soft-tissue]
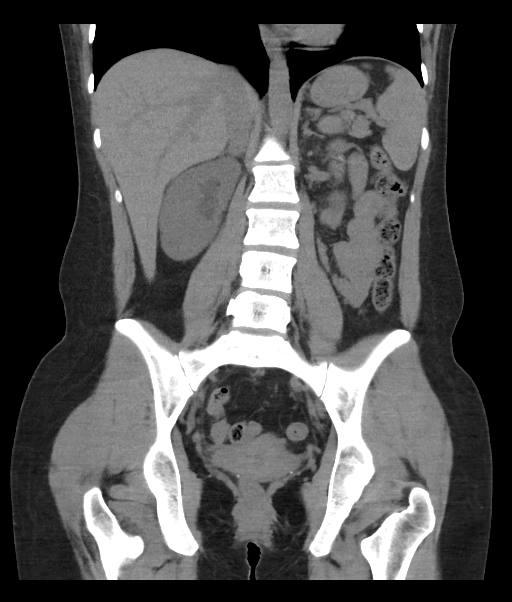

[16 of 46 positions shown; findings below may reference images not displayed]

FINDINGS: Lower chest: No acute abnormality.

Hepatobiliary: No focal liver abnormality is seen. No gallstones,
gallbladder wall thickening, or biliary dilatation.

Pancreas: Unremarkable pancreas.

Spleen: Unremarkable spleen

Adrenals/Urinary Tract: Unremarkable adrenal glands.

Right kidney with new hydronephrosis and mild perinephric stranding.
Obstructing stone at the proximal right ureter measures
approximately 5 mm. Mild stranding around the right ureter.

There are tiny additional right-sided stones at the inferior
collecting system..

Left kidney without hydronephrosis or nephrolithiasis.

Urinary bladder unremarkable.

Stomach/Bowel: Unremarkable appearance of stomach. Unremarkable
small bowel. Surgical changes at the cecum. Appendix is not
visualized, however, no inflammatory changes are present adjacent to
the cecum to indicate an appendicitis. Mild stool burden. No focal
inflammatory changes of the:. Diverticular disease without
associated inflammation.

Vascular/Lymphatic: No significant atherosclerotic disease.

No adenopathy

Reproductive: Unremarkable appearance of the uterus and the adnexa

Other: None

Musculoskeletal: No acute displaced fracture. No significant
degenerative changes of the spine
IMPRESSION: Obstructing right ureteral stone measures 5 mm in the proximal
ureter, with hydronephrosis. If there is concern for urinary tract
infection, correlation with urinalysis may be useful.

These results were called by telephone at the time of interpretation
on 05/02/2018 at [DATE] to Dr. MALDEN DON , who verbally
acknowledged these results..

Surgical changes of the colon.
